# Patient Record
Sex: Male | Born: 1990 | Marital: Single | Smoking: Former smoker
Health system: Southern US, Community
[De-identification: ages and names within clinical notes are randomized; demographics above are authoritative.]

## PROBLEM LIST (undated history)

## (undated) DIAGNOSIS — Z789 Other specified health status: Secondary | ICD-10-CM

## (undated) DIAGNOSIS — F32A Depression, unspecified: Secondary | ICD-10-CM

## (undated) DIAGNOSIS — I10 Essential (primary) hypertension: Secondary | ICD-10-CM

## (undated) DIAGNOSIS — J45909 Unspecified asthma, uncomplicated: Secondary | ICD-10-CM

## (undated) DIAGNOSIS — F329 Major depressive disorder, single episode, unspecified: Secondary | ICD-10-CM

## (undated) HISTORY — DX: Unspecified asthma, uncomplicated: J45.909

## (undated) HISTORY — DX: Depression, unspecified: F32.A

## (undated) HISTORY — DX: Major depressive disorder, single episode, unspecified: F32.9

---
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
## 2012-03-12 ENCOUNTER — Ambulatory Visit (INDEPENDENT_AMBULATORY_CARE_PROVIDER_SITE_OTHER): Payer: 59 | Admitting: Internal Medicine

## 2012-03-12 ENCOUNTER — Encounter: Payer: Self-pay | Admitting: Internal Medicine

## 2012-03-12 VITALS — BP 122/76 | HR 67 | Temp 98.6°F | Ht 65.5 in | Wt 159.2 lb

## 2012-03-12 DIAGNOSIS — L218 Other seborrheic dermatitis: Secondary | ICD-10-CM

## 2012-03-12 DIAGNOSIS — L219 Seborrheic dermatitis, unspecified: Secondary | ICD-10-CM

## 2012-03-12 DIAGNOSIS — Z0000 Encounter for general adult medical examination without abnormal findings: Secondary | ICD-10-CM | POA: Insufficient documentation

## 2012-03-12 MED ORDER — SALICYLIC ACID 3 % EX SHAM: 1.0000 "application " | MEDICATED_SHAMPOO | Freq: Every day | CUTANEOUS | Status: DC | PRN

## 2012-03-12 NOTE — Progress Notes (Signed)
Subjective:    Patient ID: Jordan Miranda, male    DOB: 1990/10/01, 21 y.o.   MRN: [id number]  HPI 21 year old male with history of asthma and depression presents to establish care. He reports he is generally feeling well. He recently graduated from high school and is planning to start community college to study music management. His only concern today is occasional rash with some peeling in his eyebrows and scalp. This is slightly itchy. He has tried anti-dandruff shampoos in the past with some improvement.  Aside from this, he reports that he generally follows a healthy diet with increased fruits and vegetables. He does not follow a specific exercise program however does play outside frequently with his younger brothers. He is not currently sexually active and reports that he is a virgin. He used to smoke but quit approximately one year ago. He does not drink alcohol. He feels safe in his home. He follows Gen. safety measures such as wearing seatbelts while driving and applying sunscreen when outdoors.  Outpatient Encounter Prescriptions as of 03/12/2012  Medication Sig Dispense Refill  . Salicylic Acid 3 % SHAM Apply 1 application topically daily as needed (apply daily x 7 days, allow to sit in hair 20-57min).  480 mL  0   BP 122/76  Pulse 67  Temp 98.6 F (37 C) (Oral)  Ht 5' 5.5" (1.664 m)  Wt 159 lb 4 oz (72.235 kg)  BMI 26.10 kg/m2  SpO2 99%  Review of Systems  Constitutional: Negative for fever, chills, activity change, appetite change, fatigue and unexpected weight change.  Eyes: Negative for visual disturbance.  Respiratory: Negative for cough and shortness of breath.   Cardiovascular: Negative for chest pain, palpitations and leg swelling.  Gastrointestinal: Negative for abdominal pain and abdominal distention.  Genitourinary: Negative for dysuria, urgency and difficulty urinating.  Musculoskeletal: Negative for arthralgias and gait problem.  Skin: Positive for rash.  Negative for color change.  Hematological: Negative for adenopathy.  Psychiatric/Behavioral: Negative for disturbed wake/sleep cycle and dysphoric mood. The patient is not nervous/anxious.        Objective:   Physical Exam  Constitutional: He is oriented to person, place, and time. He appears well-developed and well-nourished. No distress.  HENT:  Head: Normocephalic and atraumatic.  Right Ear: External ear normal.  Left Ear: External ear normal.  Nose: Nose normal.  Mouth/Throat: Oropharynx is clear and moist. No oropharyngeal exudate.  Eyes: Conjunctivae and EOM are normal. Pupils are equal, round, and reactive to light. Right eye exhibits no discharge. Left eye exhibits no discharge. No scleral icterus.  Neck: Normal range of motion. Neck supple. No tracheal deviation present. No thyromegaly present.  Cardiovascular: Normal rate, regular rhythm and normal heart sounds.  Exam reveals no gallop and no friction rub.   No murmur heard. Pulmonary/Chest: Effort normal and breath sounds normal. No respiratory distress. He has no wheezes. He has no rales. He exhibits no tenderness.  Abdominal: Soft. Bowel sounds are normal. He exhibits no distension and no mass. There is no tenderness. There is no guarding.  Musculoskeletal: Normal range of motion. He exhibits no edema.  Lymphadenopathy:    He has no cervical adenopathy.  Neurological: He is alert and oriented to person, place, and time. No cranial nerve deficit. Coordination normal.  Skin: Skin is warm and dry. Rash (some flaking areas over the scalp and eyebrows) noted. He is not diaphoretic. No erythema. No pallor.  Psychiatric: He has a normal mood and affect. His behavior is  normal. Judgment and thought content normal.          Assessment & Plan:

## 2012-03-12 NOTE — Assessment & Plan Note (Signed)
Exam is consistent with seborrheic dermatitis. Will treat with Selsun shampoo. Patient will call if symptoms are not improving.

## 2012-03-12 NOTE — Assessment & Plan Note (Signed)
General exam is normal today. Review Gen. health maintenance. Patient reports that he is up-to-date on vaccinations including tetanus and pertussis vaccine. We discussed getting basic lab work including CBC, CMP, lipid profile. Patient would prefer to hold off for now. He will plan to get labs checked next week or the week after. Discussed basic preventative health measures including STD prevention, general safety while driving and while outdoors. We also discussed importance of abstaining from smoking. Encourage continued efforts at healthy diet and regular physical activity.

## 2012-04-11 ENCOUNTER — Other Ambulatory Visit (INDEPENDENT_AMBULATORY_CARE_PROVIDER_SITE_OTHER): Payer: 59

## 2012-04-11 DIAGNOSIS — Z0000 Encounter for general adult medical examination without abnormal findings: Secondary | ICD-10-CM

## 2012-12-25 IMAGING — CR DG CHEST 2V
1 series · 2 of 2 positions shown · non-contrast
Comparison: none

REASON FOR EXAM: wheezing    Flex 8
COMMENTS:   LMP: (Male)

PROCEDURE:     DXR - DXR CHEST PA (OR AP) AND LATERAL  - July 02, 2011  [DATE]
RESULT:     The lungs are clear. The heart and pulmonary vessels are normal.
The bony and mediastinal structures are unremarkable. There is no effusion.
There is no pneumothorax or evidence of congestive failure.

[Series 1: w chest pa · 0.14mm/px · 2 of 2 slices shown]
[im 1/2]
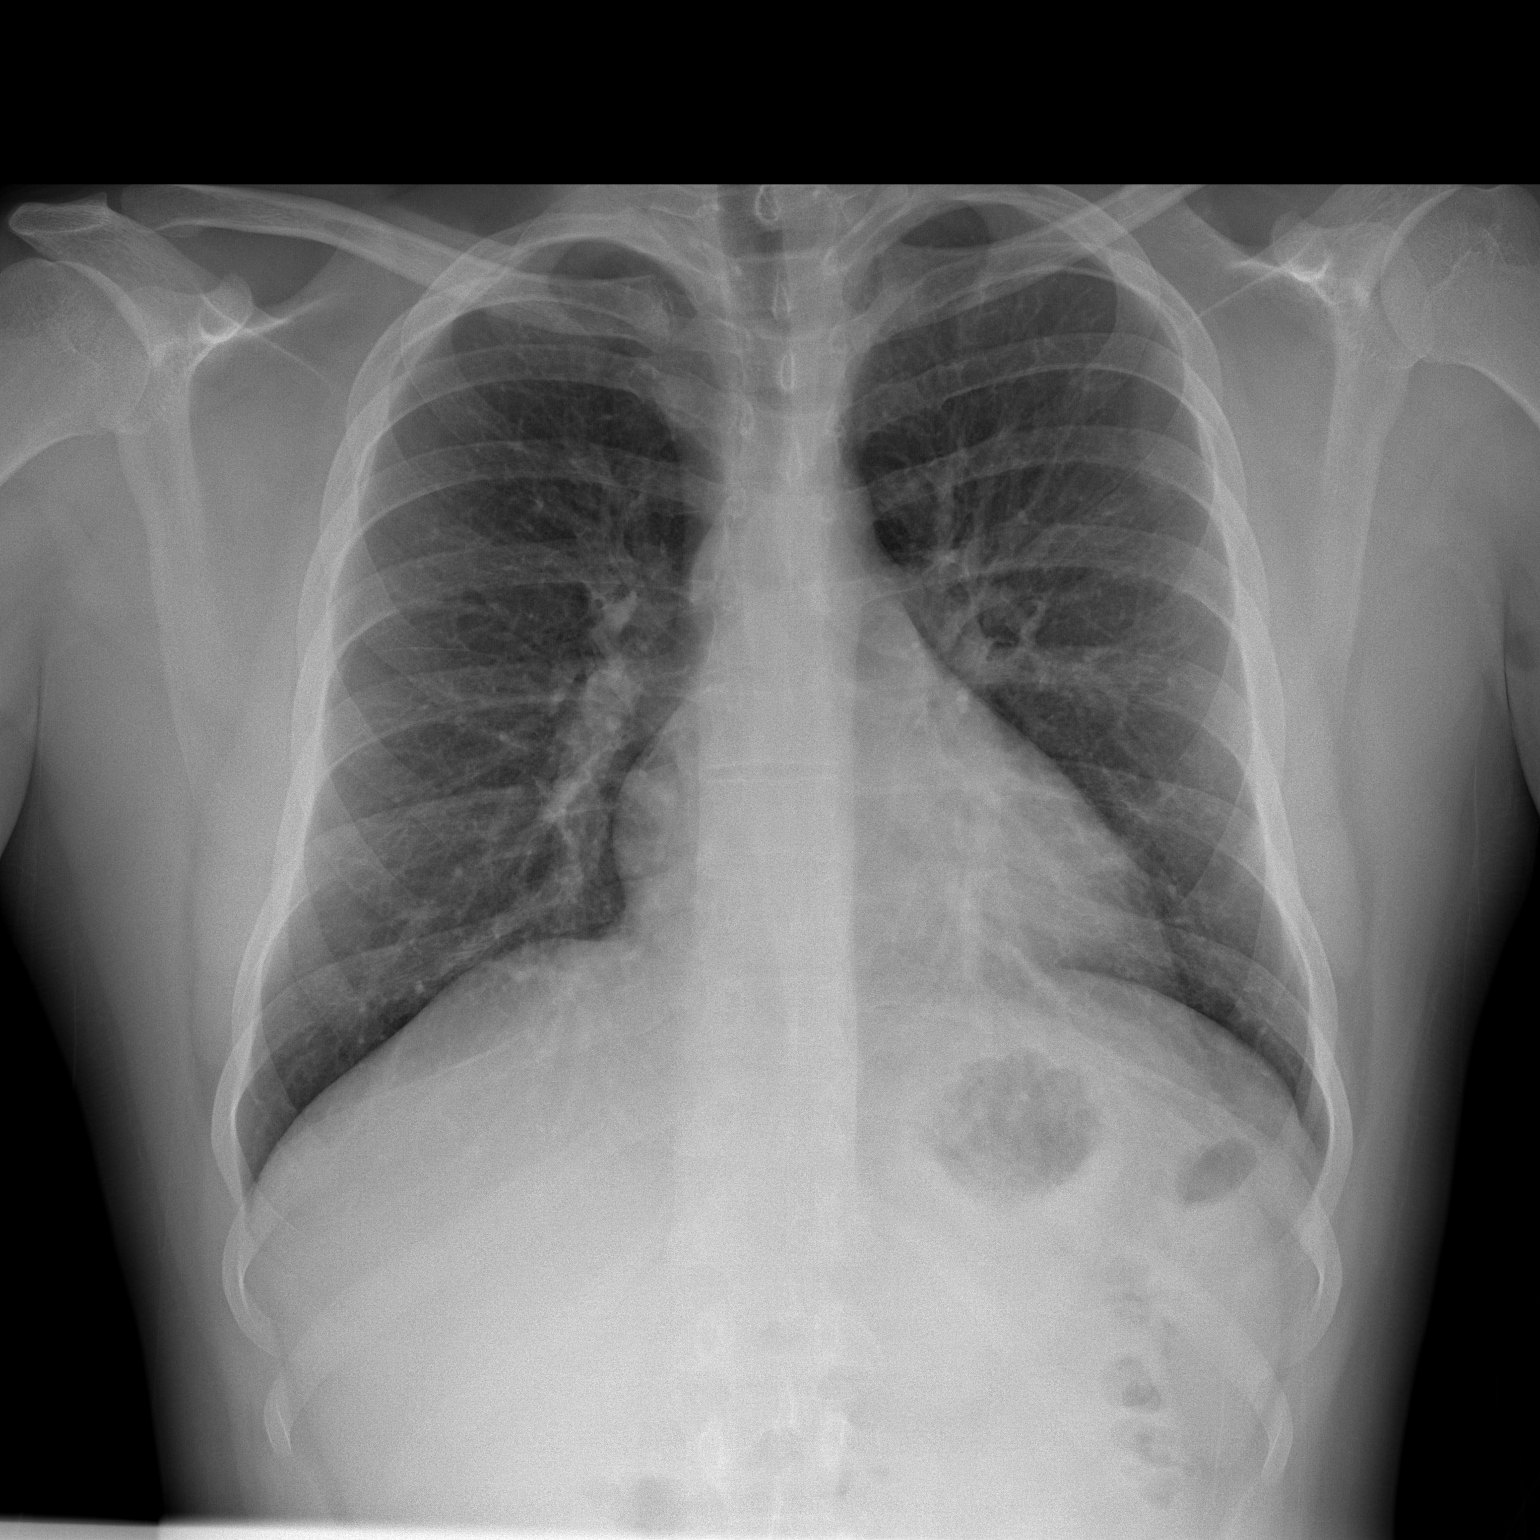
[im 2/2]
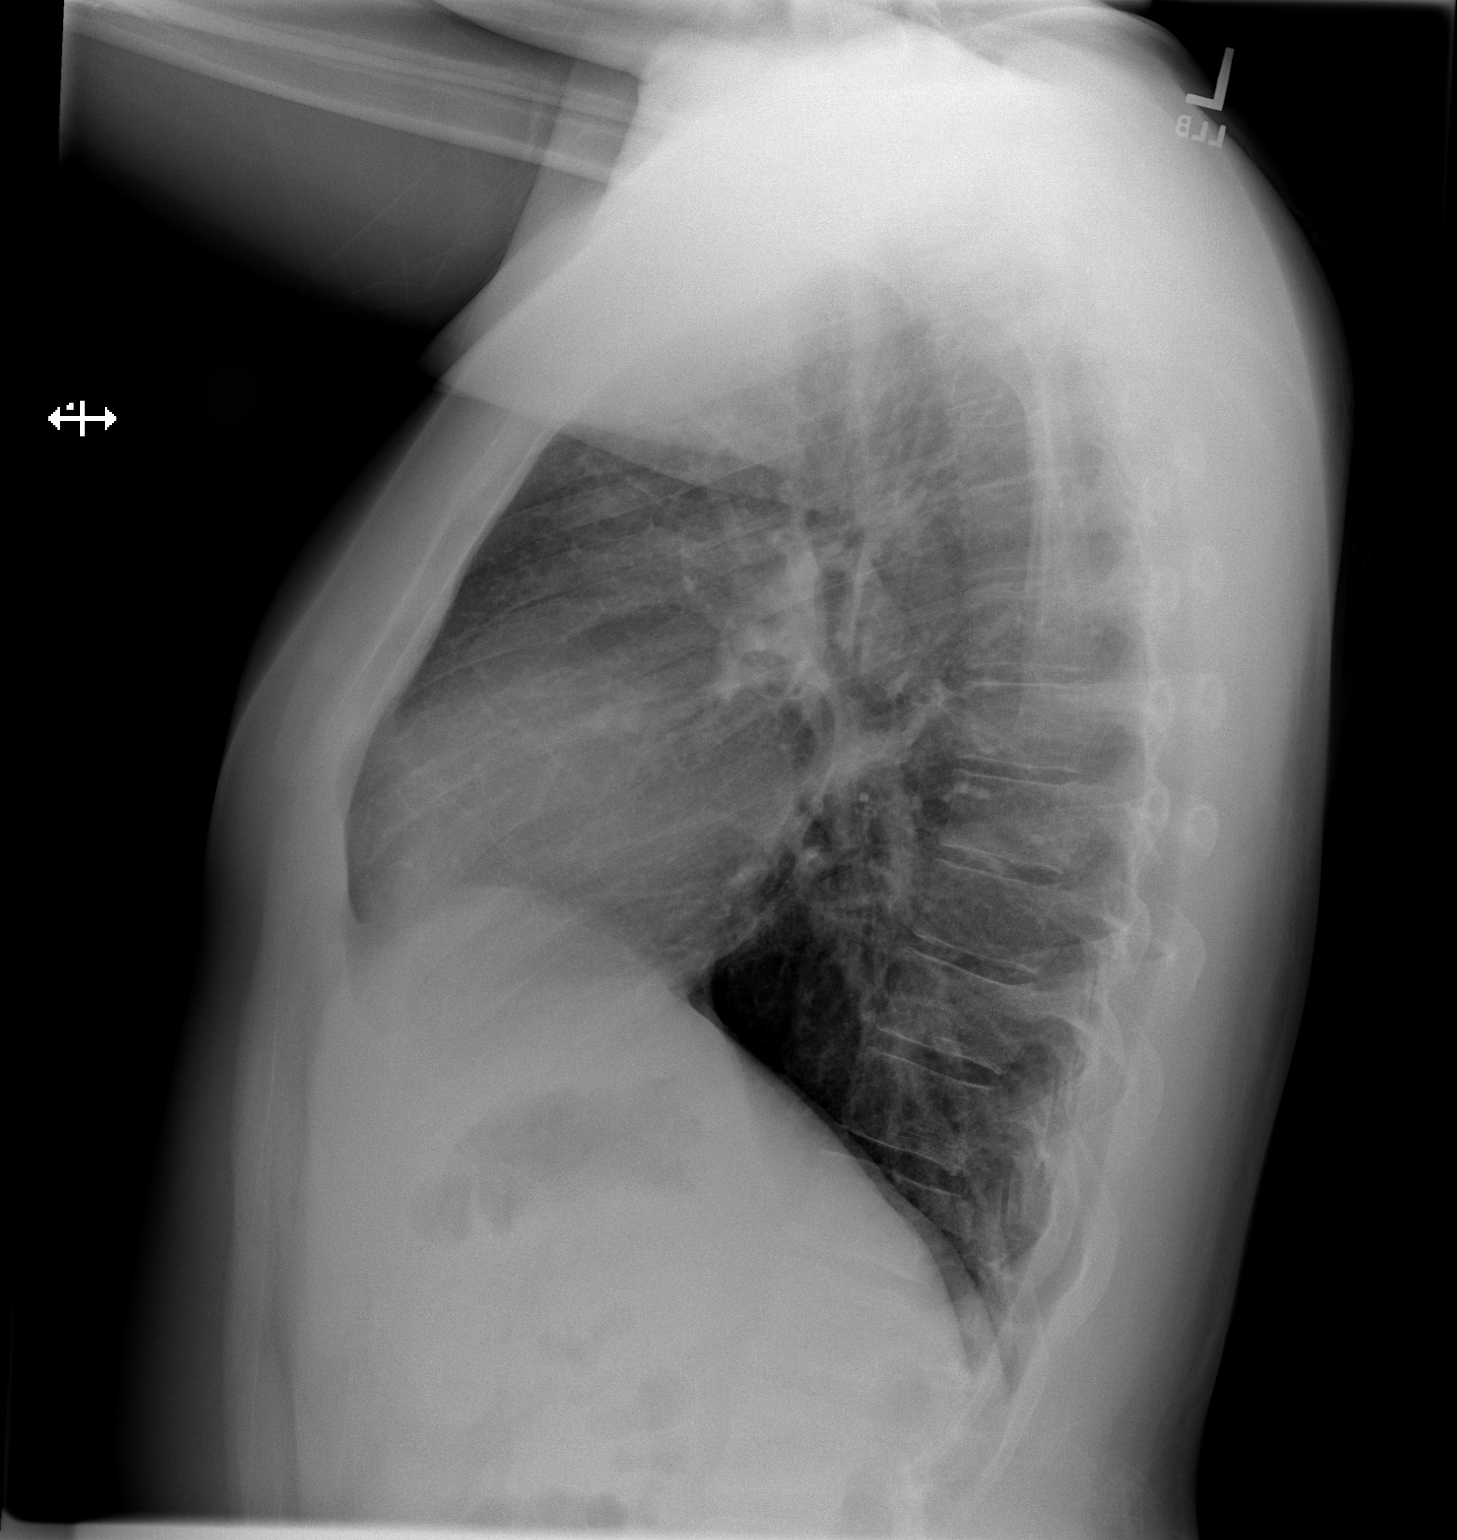

[2 of 2 positions shown; findings below may reference images not displayed]

IMPRESSION: No acute cardiopulmonary disease.

## 2013-09-25 ENCOUNTER — Ambulatory Visit: Payer: 59 | Admitting: Internal Medicine

## 2013-09-25 DIAGNOSIS — Z0289 Encounter for other administrative examinations: Secondary | ICD-10-CM

## 2014-01-29 ENCOUNTER — Ambulatory Visit (INDEPENDENT_AMBULATORY_CARE_PROVIDER_SITE_OTHER): Payer: 59 | Admitting: Adult Health

## 2014-01-29 ENCOUNTER — Encounter: Payer: Self-pay | Admitting: Adult Health

## 2014-01-29 ENCOUNTER — Encounter (INDEPENDENT_AMBULATORY_CARE_PROVIDER_SITE_OTHER): Payer: Self-pay

## 2014-01-29 VITALS — BP 140/82 | HR 73 | Temp 98.2°F | Resp 14 | Wt 183.8 lb

## 2014-01-29 DIAGNOSIS — L219 Seborrheic dermatitis, unspecified: Secondary | ICD-10-CM

## 2014-01-29 DIAGNOSIS — R42 Dizziness and giddiness: Secondary | ICD-10-CM

## 2014-01-29 DIAGNOSIS — F341 Dysthymic disorder: Secondary | ICD-10-CM

## 2014-01-29 DIAGNOSIS — F418 Other specified anxiety disorders: Secondary | ICD-10-CM

## 2014-01-29 DIAGNOSIS — L218 Other seborrheic dermatitis: Secondary | ICD-10-CM

## 2014-01-29 MED ORDER — SALICYLIC ACID 3 % EX SHAM: 1.0000 "application " | MEDICATED_SHAMPOO | Freq: Every day | CUTANEOUS | Status: DC | PRN

## 2014-01-29 MED ORDER — FLUOXETINE HCL 20 MG PO CAPS: 20.0000 mg | ORAL_CAPSULE | Freq: Every day | ORAL | Status: DC

## 2014-01-29 MED ORDER — BUSPIRONE HCL 5 MG PO TABS: 5.0000 mg | ORAL_TABLET | Freq: Three times a day (TID) | ORAL | Status: DC

## 2014-01-29 NOTE — Progress Notes (Signed)
Patient ID: Jordan Miranda, male   DOB: 07/24/1990, 23 y.o.   MRN: [id number]   Subjective:    Patient ID: Jordan MuttersMichael Austin Miranda, male    DOB: 05/28/1991, 23 y.o.   MRN: [id number]  HPI  "Dizziness" that started a few weeks ago. He reports that he was watching an interview with an artist that he likes and he noticed that this artist has the same symptoms - Foggy feeling, out of body,  "it is not him", a dream-like state. He reports having the feeling now. He was smoking marijuana regularly and quit about a month ago. He has been smoking pot since high school and would stop periodically without having any of these symptoms. Reports "he almost feels like a disassociation feeling".  He has periodic episodes of feeling depressed. He also has social anxiety disorder. Stresses about being in a crowd and speaking to people. He wonders if his symptoms are a way that he is using to help cope with this disorder. He has not taken any medication for depression or anxiety. Feels that anxiety is more of a problem for him.  Past Medical History  Diagnosis Date  . Depression   . Asthma     No current outpatient prescriptions on file prior to visit.   No current facility-administered medications on file prior to visit.     Review of Systems  Neurological: Positive for dizziness.       "weird feeling"  Psychiatric/Behavioral: Positive for sleep disturbance (cannot turn off his thoughts). Negative for suicidal ideas and self-injury. The patient is nervous/anxious.        Mild depression, foggy feeling, out of body experience, disassociation.   All other systems reviewed and are negative.      Objective:  BP 140/82  Pulse 73  Temp(Src) 98.2 F (36.8 C) (Oral)  Resp 14  Wt 183 lb 12 oz (83.348 kg)  SpO2 100%   Physical Exam  Constitutional: He is oriented to person, place, and time. No distress.  HENT:  Head: Normocephalic and atraumatic.  Eyes: Conjunctivae and EOM are normal.  Neck: Neck  supple.  Cardiovascular: Normal rate and regular rhythm.  Exam reveals no gallop.   No murmur heard. Pulmonary/Chest: Effort normal and breath sounds normal. No respiratory distress.  Musculoskeletal: Normal range of motion.  Neurological: He is alert and oriented to person, place, and time. No cranial nerve deficit. Coordination normal.  Skin: Skin is warm and dry.  Psychiatric: He has a normal mood and affect. His behavior is normal.  He has good eye contact and communicates well with provider.       Assessment & Plan:   1. Dizziness His description of symptoms does not sound like dizziness but more along the lines of panic disorder and depression. I will check labs to r/o clinical reason for his symptoms.  - CBC with Differential - TSH - Comprehensive metabolic panel  2. Depression with anxiety Start Prozac 20 mg daily and buspar 5 mg bid Return for follow up in 1 month If symptoms listed in HPI do not improve would recommend referral to Psych.

## 2014-01-29 NOTE — Progress Notes (Signed)
Pre visit review using our clinic review tool, if applicable. No additional management support is needed unless otherwise documented below in the visit note. 

## 2014-01-30 LAB — CBC WITH DIFFERENTIAL/PLATELET
Basophils Absolute: 0 10*3/uL (ref 0.0–0.2)
Basos: 1 %
Eos: 2 %
Eosinophils Absolute: 0.1 10*3/uL (ref 0.0–0.4)
HCT: 45.1 % (ref 37.5–51.0)
Hemoglobin: 15.6 g/dL (ref 12.6–17.7)
Immature Grans (Abs): 0 10*3/uL (ref 0.0–0.1)
Immature Granulocytes: 0 %
Lymphocytes Absolute: 2.9 10*3/uL (ref 0.7–3.1)
Lymphs: 52 %
MCH: 29.1 pg (ref 26.6–33.0)
MCHC: 34.6 g/dL (ref 31.5–35.7)
MCV: 84 fL (ref 79–97)
Monocytes Absolute: 0.4 10*3/uL (ref 0.1–0.9)
Monocytes: 8 %
Neutrophils Absolute: 2 10*3/uL (ref 1.4–7.0)
Neutrophils Relative %: 37 %
RBC: 5.37 x10E6/uL (ref 4.14–5.80)
RDW: 13 % (ref 12.3–15.4)
WBC: 5.5 10*3/uL (ref 3.4–10.8)

## 2014-01-30 LAB — COMPREHENSIVE METABOLIC PANEL
ALT: 30 IU/L (ref 0–44)
AST: 20 IU/L (ref 0–40)
Albumin/Globulin Ratio: 2.1 (ref 1.1–2.5)
Albumin: 4.8 g/dL (ref 3.5–5.5)
Alkaline Phosphatase: 101 IU/L (ref 39–117)
BUN/Creatinine Ratio: 12 (ref 8–19)
BUN: 12 mg/dL (ref 6–20)
CO2: 24 mmol/L (ref 18–29)
Calcium: 9.8 mg/dL (ref 8.7–10.2)
Chloride: 103 mmol/L (ref 97–108)
Creatinine, Ser: 1.02 mg/dL (ref 0.76–1.27)
GFR calc Af Amer: 122 mL/min/{1.73_m2} (ref >59)
GFR calc non Af Amer: 105 mL/min/{1.73_m2} (ref >59)
Globulin, Total: 2.3 g/dL (ref 1.5–4.5)
Glucose: 98 mg/dL (ref 65–99)
Potassium: 4.1 mmol/L (ref 3.5–5.2)
Sodium: 146 mmol/L — ABNORMAL HIGH (ref 134–144)
Total Bilirubin: 0.4 mg/dL (ref 0.0–1.2)
Total Protein: 7.1 g/dL (ref 6.0–8.5)

## 2014-01-30 LAB — TSH: TSH: 1.99 u[IU]/mL (ref 0.450–4.500)

## 2014-02-01 ENCOUNTER — Other Ambulatory Visit: Payer: Self-pay | Admitting: Adult Health

## 2014-02-01 DIAGNOSIS — E870 Hyperosmolality and hypernatremia: Secondary | ICD-10-CM

## 2014-02-16 ENCOUNTER — Other Ambulatory Visit: Payer: 59

## 2014-02-17 ENCOUNTER — Encounter: Payer: Self-pay | Admitting: Adult Health

## 2014-02-17 ENCOUNTER — Ambulatory Visit (INDEPENDENT_AMBULATORY_CARE_PROVIDER_SITE_OTHER): Payer: 59 | Admitting: Adult Health

## 2014-02-17 ENCOUNTER — Other Ambulatory Visit (INDEPENDENT_AMBULATORY_CARE_PROVIDER_SITE_OTHER): Payer: 59

## 2014-02-17 VITALS — BP 135/88 | HR 80 | Temp 98.0°F | Resp 14 | Wt 181.8 lb

## 2014-02-17 DIAGNOSIS — F418 Other specified anxiety disorders: Secondary | ICD-10-CM

## 2014-02-17 DIAGNOSIS — E870 Hyperosmolality and hypernatremia: Secondary | ICD-10-CM

## 2014-02-17 DIAGNOSIS — F341 Dysthymic disorder: Secondary | ICD-10-CM

## 2014-02-17 DIAGNOSIS — R059 Cough, unspecified: Secondary | ICD-10-CM

## 2014-02-17 DIAGNOSIS — H65199 Other acute nonsuppurative otitis media, unspecified ear: Secondary | ICD-10-CM

## 2014-02-17 DIAGNOSIS — H65192 Other acute nonsuppurative otitis media, left ear: Secondary | ICD-10-CM

## 2014-02-17 DIAGNOSIS — R05 Cough: Secondary | ICD-10-CM

## 2014-02-17 MED ORDER — AZITHROMYCIN 250 MG PO TABS: ORAL_TABLET | ORAL | Status: DC

## 2014-02-17 MED ORDER — ALBUTEROL SULFATE (2.5 MG/3ML) 0.083% IN NEBU
2.5000 mg | INHALATION_SOLUTION | Freq: Once | RESPIRATORY_TRACT | Status: AC
Start: 2014-02-17 — End: 2014-02-17
  Administered 2014-02-17: 2.5 mg via RESPIRATORY_TRACT

## 2014-02-17 MED ORDER — ALBUTEROL SULFATE HFA 108 (90 BASE) MCG/ACT IN AERS: 2.0000 | INHALATION_SPRAY | Freq: Four times a day (QID) | RESPIRATORY_TRACT | Status: DC | PRN

## 2014-02-17 MED ORDER — PREDNISONE 10 MG PO TABS: ORAL_TABLET | ORAL | Status: DC

## 2014-02-17 NOTE — Patient Instructions (Addendum)
  Please go to the [facility] for your chest xray.  Start Azithromycin 2 tablets today then 1 tablet daily for the next 4 days.  Albuterol inhaler 2 puff into the lungs every 6 hours for the next 3 days then use as needed for coughing, shortness of breath, wheezing or chest tightness  Start prednisone taper as follows:  Day #1 - take 6 tablets Day #2 - take 5 tablets Day #3 - take 4 tablets Day #4 - take 3 tablets Day #5 - take 2 tablets Day #6 - take 1 tablet  Return to clinic for follow up in 1 week.  I am referring you to Psychiatry for your depression/anxiety.

## 2014-02-17 NOTE — Progress Notes (Signed)
Pre visit review using our clinic review tool, if applicable. No additional management support is needed unless otherwise documented below in the visit note. 

## 2014-02-17 NOTE — Progress Notes (Signed)
Patient ID: Jordan Miranda, male   DOB: 09/12/1990, 23 y.o.   MRN: [id number]   Subjective:    Patient ID: Jordan MuttersMichael Austin Burciaga, male    DOB: 01/26/1991, 23 y.o.   MRN: [id number]  Cough Associated symptoms include ear pain, a fever, postnasal drip, rhinorrhea and a sore throat. Pertinent negatives include no chills, shortness of breath or wheezing.    Pt presents with coughing, sinus drainage (green), fever, chills, left ear pain that started approximately 4 days ago. He has been taking DayQuil and tylenol for his fever. Denies shortness of breath.  Also, pt was seen in clinic on 01/29/14 for symptoms of depression with anxiety and foggy feeling, "out of body experience". He was started on prozac and buspar. Reports he did not tolerate either of these medications. He denies suicidal ideations. He reports that his mother has told him that he should see a psychiatrist.   Past Medical History  Diagnosis Date  . Depression   . Asthma     Current Outpatient Prescriptions on File Prior to Visit  Medication Sig Dispense Refill  . Salicylic Acid 3 % SHAM Apply 1 application topically daily as needed (apply daily x 7 days, allow to sit in hair 20-2530min).  480 mL  0   No current facility-administered medications on file prior to visit.     Review of Systems  Constitutional: Positive for fever. Negative for chills.  HENT: Positive for congestion, ear pain, postnasal drip, rhinorrhea, sinus pressure and sore throat.   Respiratory: Positive for cough. Negative for shortness of breath and wheezing.   Gastrointestinal: Negative.   Psychiatric/Behavioral: The patient is nervous/anxious.        Depression  All other systems reviewed and are negative.      Objective:  BP 135/88  Pulse 80  Temp(Src) 98 F (36.7 C) (Oral)  Resp 14  Wt 181 lb 12 oz (82.441 kg)  SpO2 99%   Physical Exam  Constitutional: He is oriented to person, place, and time. He appears well-developed and  well-nourished. No distress.  HENT:  Head: Normocephalic and atraumatic.  Right Ear: External ear normal.  Mouth/Throat: Oropharynx is clear and moist. No oropharyngeal exudate.  Left otitis media  Eyes: Conjunctivae and EOM are normal.  Neck: Normal range of motion. Neck supple.  Cardiovascular: Normal rate, regular rhythm and normal heart sounds.  Exam reveals no gallop and no friction rub.   No murmur heard. Pulmonary/Chest: Effort normal. No respiratory distress. He has wheezes. He has no rales.  Rhonchi scattered  Musculoskeletal: Normal range of motion.  Lymphadenopathy:    He has no cervical adenopathy.  Neurological: He is alert and oriented to person, place, and time.  Skin: Skin is warm and dry.  Psychiatric: He has a normal mood and affect. His behavior is normal. Judgment and thought content normal.      Assessment & Plan:   1. Cough Lungs sound congested, wheezing. Suspect CAP. Send for chest xray. Start Azithromycin. RTC in 1 week for f/u - DG Chest 2 View; Future  2. Depression with anxiety Refer to psychiatry for his ongoing symptoms. Did not tolerate prozac or buspar. Reports having "out of body experiences", foggy feeling. - Ambulatory referral to Psychiatry  3. Acute nonsuppurative otitis media of left ear Azithromycin

## 2014-02-17 NOTE — Addendum Note (Signed)
Addended by: Chandra BatchNIXON, Abella Shugart E on: 02/17/2014 01:38 PM   Modules accepted: Orders

## 2014-02-24 ENCOUNTER — Other Ambulatory Visit: Payer: 59

## 2015-12-26 ENCOUNTER — Ambulatory Visit
Admission: EM | Admit: 2015-12-26 | Discharge: 2015-12-26 | Disposition: A | Discharge status: Home / Self Care | Payer: 59 | Attending: Emergency Medicine | Admitting: Emergency Medicine

## 2015-12-26 DIAGNOSIS — J029 Acute pharyngitis, unspecified: Secondary | ICD-10-CM | POA: Diagnosis not present

## 2015-12-26 DIAGNOSIS — J309 Allergic rhinitis, unspecified: Secondary | ICD-10-CM

## 2015-12-26 HISTORY — DX: Other specified health status: Z78.9

## 2015-12-26 LAB — RAPID STREP SCREEN (MED CTR MEBANE ONLY): Streptococcus, Group A Screen (Direct): NEGATIVE

## 2015-12-26 MED ORDER — LIDOCAINE VISCOUS 2 % MT SOLN: 15.0000 mL | Freq: Three times a day (TID) | OROMUCOSAL | Status: DC | PRN

## 2015-12-26 MED ORDER — LORATADINE 10 MG PO TABS: 10.0000 mg | ORAL_TABLET | Freq: Every day | ORAL | Status: DC

## 2015-12-26 NOTE — Discharge Instructions (Signed)
Take medication as prescribed. Rest. Drink plenty of fluids.   Follow up with your primary care physician this week as needed. Return to Urgent care for new or worsening concerns.    Allergic Rhinitis Allergic rhinitis is when the mucous membranes in the nose respond to allergens. Allergens are particles in the air that cause your body to have an allergic reaction. This causes you to release allergic antibodies. Through a chain of events, these eventually cause you to release histamine into the blood stream. Although meant to protect the body, it is this release of histamine that causes your discomfort, such as frequent sneezing, congestion, and an itchy, runny nose.  CAUSES Seasonal allergic rhinitis (hay fever) is caused by pollen allergens that may come from grasses, trees, and weeds. Year-round allergic rhinitis (perennial allergic rhinitis) is caused by allergens such as house dust mites, pet dander, and mold spores. SYMPTOMS  Nasal stuffiness (congestion).  Itchy, runny nose with sneezing and tearing of the eyes. DIAGNOSIS Your health care provider can help you determine the allergen or allergens that trigger your symptoms. If you and your health care provider are unable to determine the allergen, skin or blood testing may be used. Your health care provider will diagnose your condition after taking your health history and performing a physical exam. Your health care provider may assess you for other related conditions, such as asthma, pink eye, or an ear infection. TREATMENT Allergic rhinitis does not have a cure, but it can be controlled by:  Medicines that block allergy symptoms. These may include allergy shots, nasal sprays, and oral antihistamines.  Avoiding the allergen. Hay fever may often be treated with antihistamines in pill or nasal spray forms. Antihistamines block the effects of histamine. There are over-the-counter medicines that may help with nasal congestion and swelling  around the eyes. Check with your health care provider before taking or giving this medicine. If avoiding the allergen or the medicine prescribed do not work, there are many new medicines your health care provider can prescribe. Stronger medicine may be used if initial measures are ineffective. Desensitizing injections can be used if medicine and avoidance does not work. Desensitization is when a patient is given ongoing shots until the body becomes less sensitive to the allergen. Make sure you follow up with your health care provider if problems continue. HOME CARE INSTRUCTIONS It is not possible to completely avoid allergens, but you can reduce your symptoms by taking steps to limit your exposure to them. It helps to know exactly what you are allergic to so that you can avoid your specific triggers. SEEK MEDICAL CARE IF:  You have a fever.  You develop a cough that does not stop easily (persistent).  You have shortness of breath.  You start wheezing.  Symptoms interfere with normal daily activities.   This information is not intended to replace advice given to you by your health care provider. Make sure you discuss any questions you have with your health care provider.   Document Released: 04/03/2001 Document Revised: 07/30/2014 Document Reviewed: 03/16/2013 Elsevier Interactive Patient Education 2016 [employer]  Pharyngitis Pharyngitis is redness, pain, and swelling (inflammation) of your pharynx.  CAUSES  Pharyngitis is usually caused by infection. Most of the time, these infections are from viruses (viral) and are part of a cold. However, sometimes pharyngitis is caused by bacteria (bacterial). Pharyngitis can also be caused by allergies. Viral pharyngitis may be spread from person to person by coughing, sneezing, and personal items or utensils (  cups, forks, spoons, toothbrushes). Bacterial pharyngitis may be spread from person to person by more intimate contact, such as kissing.    SIGNS AND SYMPTOMS  Symptoms of pharyngitis include:   Sore throat.   Tiredness (fatigue).   Low-grade fever.   Headache.  Joint pain and muscle aches.  Skin rashes.  Swollen lymph nodes.  Plaque-like film on throat or tonsils (often seen with bacterial pharyngitis). DIAGNOSIS  Your health care provider will ask you questions about your illness and your symptoms. Your medical history, along with a physical exam, is often all that is needed to diagnose pharyngitis. Sometimes, a rapid strep test is done. Other lab tests may also be done, depending on the suspected cause.  TREATMENT  Viral pharyngitis will usually get better in 3-4 days without the use of medicine. Bacterial pharyngitis is treated with medicines that kill germs (antibiotics).  HOME CARE INSTRUCTIONS   Drink enough water and fluids to keep your urine clear or pale yellow.   Only take over-the-counter or prescription medicines as directed by your health care provider:   If you are prescribed antibiotics, make sure you finish them even if you start to feel better.   Do not take aspirin.   Get lots of rest.   Gargle with 8 oz of salt water ( tsp of salt per 1 qt of water) as often as every 1-2 hours to soothe your throat.   Throat lozenges (if you are not at risk for choking) or sprays may be used to soothe your throat. SEEK MEDICAL CARE IF:   You have large, tender lumps in your neck.  You have a rash.  You cough up green, yellow-brown, or bloody spit. SEEK IMMEDIATE MEDICAL CARE IF:   Your neck becomes stiff.  You drool or are unable to swallow liquids.  You vomit or are unable to keep medicines or liquids down.  You have severe pain that does not go away with the use of recommended medicines.  You have trouble breathing (not caused by a stuffy nose). MAKE SURE YOU:   Understand these instructions.  Will watch your condition.  Will get help right away if you are not doing well or  get worse.   This information is not intended to replace advice given to you by your health care provider. Make sure you discuss any questions you have with your health care provider.   Document Released: 07/09/2005 Document Revised: 04/29/2013 Document Reviewed: 03/16/2013 Elsevier Interactive Patient Education [employer]

## 2015-12-26 NOTE — ED Provider Notes (Signed)
[facility]  ____________________________________________  Time seen: Approximately 10:03 AM  I have reviewed the triage vital signs and the nursing notes.   HISTORY  Chief Complaint Sore Throat   HPI Jordan Miranda is a 25 y.o. male presents for the complaint of sore throat since this morning. Patient reports some nasal congestion, and rare cough. Patient states sore throat is a 2 out of 10 scratchy irritation sensation. Reports able to continue to eat and drink well. Reports some history of seasonal allergies. Reports did take over-the-counter DayQuil this morning which helped the symptoms. Denies fevers. Reports his cousin which he was around this weekend was just diagnosed with strep throat Friday, and patient expresses concern of strep throat.  Denies chest pain, shortness of breath, dizziness, weakness, fevers, vision changes, neck pain, back pain, rash, abdominal pain, dysuria, dizziness or weakness.   Past Medical History  Diagnosis Date  . Patient denies medical problems     There are no active problems to display for this patient.   History reviewed. No pertinent past surgical history.  Current Outpatient Rx  Name  Route  Sig  Dispense  Refill  .           Marland Kitchen             Allergies Review of patient's allergies indicates no known allergies.  History reviewed. No pertinent family history.  Social History Social History  Substance Use Topics  . Smoking status: Former Games developer  . Smokeless tobacco: None  . Alcohol Use: Yes     Comment: social    Review of Systems Constitutional: No fever/chills Eyes: No visual changes. ENT: As above.  Cardiovascular: Denies chest pain. Respiratory: Denies shortness of breath. Gastrointestinal: No abdominal pain.  No nausea, no vomiting.  No diarrhea.  No constipation. Genitourinary: Negative for dysuria. Musculoskeletal: Negative for back pain. Skin: Negative for rash. Neurological: Negative for headaches,  focal weakness or numbness.  10-point ROS otherwise negative.  ____________________________________________   PHYSICAL EXAM:  VITAL SIGNS: ED Triage Vitals  Enc Vitals Group     BP 12/26/15 0952 153/100 mmHg     Pulse Rate 12/26/15 0952 57     Resp 12/26/15 0952 20     Temp 12/26/15 0952 98.1 F (36.7 C)     Temp Source 12/26/15 0952 Oral     SpO2 12/26/15 0952 100 %     Weight 12/26/15 0952 207 lb 5 oz (94.036 kg)     Height 12/26/15 0952 5' 6.5" (1.689 m)     Head Cir --      Peak Flow --      Pain Score 12/26/15 0956 2     Pain Loc --      Pain Edu? --      Excl. in GC? --    Constitutional: Alert and oriented. Well appearing and in no acute distress. Eyes: Conjunctivae are normal. PERRL. EOMI. Head: Atraumatic. No sinus tenderness to palpation. No swelling. No erythema.  Ears: no erythema, normal TMs bilaterally.   Nose:Mild nasal congestion with clear rhinorrhea  Mouth/Throat: Mucous membranes are moist. Mild pharyngeal erythema. No tonsillar swelling or exudate.  Neck: No stridor.  No cervical spine tenderness to palpation. Hematological/Lymphatic/Immunilogical: No cervical lymphadenopathy. Cardiovascular: Normal rate, regular rhythm. Grossly normal heart sounds.  Good peripheral circulation. Respiratory: Normal respiratory effort.  No retractions. Lungs CTAB.No wheezes, rales or rhonchi. Good air movement.  Gastrointestinal: Soft and nontender. Normal Bowel sounds. No CVA tenderness. Musculoskeletal: No lower  or upper extremity tenderness nor edema. No cervical, thoracic or lumbar tenderness to palpation. Neurologic:  Normal speech and language. No gross focal neurologic deficits are appreciated. No gait instability. Skin:  Skin is warm, dry and intact. No rash noted. Psychiatric: Mood and affect are normal. Speech and behavior are normal.  ____________________________________________   LABS (all labs ordered are listed, but only abnormal results are  displayed)  Labs Reviewed  RAPID STREP SCREEN (NOT [facility])  CULTURE, GROUP A STREP [facility])    INITIAL IMPRESSION / ASSESSMENT AND PLAN / ED COURSE  Pertinent labs & imaging results that were available during my care of the patient were reviewed by me and considered in my medical decision making (see chart for details).  Overall very well-appearing patient. No acute distress. Presents for the complaints of sore throat since this a.m. Also reports some runny nose and occasional cough. Recent strep exposure and concern of strep throat per patient. Lungs clear throughout. Abdomen soft and nontender. Mild pharyngeal erythema, no tonsillar swelling or exudate. Suspect viral pharyngitis and upper respiratory infection versus allergic rhinitis. Will evaluate quick strep.   Quick strep negative, will culture. Suspect viral pharyngitis and upper respiratory infection versus allergic rhinitis. Discussed with patient and his aunt at bedside, will treat supportively and symptomatically. When necessary Viscous Lidocaine gargles. When necessary Claritin. Encouraged rest, fluids, over-the-counter Tylenol or ibuprofen as needed. Work note for today given. Discussed indication, risks and benefits of medications with patient.  Also discussed blood pressure in urgent care today and encourage close follow-up. Patient denies any history of blood pressure elevation. Denies chest pain, shortness of breath, dizziness, vision changes, headache or weakness. Discussed with patient monitoring over-the-counter medication use as some over-the-counter medications may be contributing elevation of blood pressure. Encouraged close PCP follow-up and patient and his aunt at bedside verbalized understanding and they will follow-up this week.  Discussed follow up with Primary care physician this week. Discussed follow up and return parameters including no resolution or any worsening concerns. Patient verbalized understanding and agreed  to plan.   ____________________________________________   FINAL CLINICAL IMPRESSION(S) / ED DIAGNOSES  Final diagnoses:  Pharyngitis  Allergic rhinitis, unspecified allergic rhinitis type     Discharge Medication List as of 12/26/2015 10:20 AM    START taking these medications   Details  lidocaine (XYLOCAINE) 2 % solution Use as directed 15 mLs in the mouth or throat every 8 (eight) hours as needed (sore throat. gargle and spit as needed for sore throat.)., Starting 12/26/2015, Until Discontinued, Normal    loratadine (CLARITIN) 10 MG tablet Take 1 tablet (10 mg total) by mouth daily., Starting 12/26/2015, Until Discontinued, Normal        Note: This dictation was prepared with Dragon dictation along with smaller phrase technology. Any transcriptional errors that result from this process are unintentional.       Renford DillsLindsey Zaydrian Batta, NP 12/26/15 1105

## 2015-12-28 LAB — CULTURE, GROUP A STREP (THRC)

## 2024-05-20 ENCOUNTER — Encounter: Payer: Self-pay | Admitting: Emergency Medicine

## 2024-05-20 ENCOUNTER — Ambulatory Visit
Admission: EM | Admit: 2024-05-20 | Discharge: 2024-05-20 | Disposition: A | Discharge status: Home / Self Care | Attending: Physician Assistant | Admitting: Physician Assistant

## 2024-05-20 DIAGNOSIS — H5789 Other specified disorders of eye and adnexa: Secondary | ICD-10-CM | POA: Diagnosis not present

## 2024-05-20 DIAGNOSIS — H00011 Hordeolum externum right upper eyelid: Secondary | ICD-10-CM | POA: Diagnosis not present

## 2024-05-20 HISTORY — DX: Essential (primary) hypertension: I10

## 2024-05-20 MED ORDER — ERYTHROMYCIN 5 MG/GM OP OINT: TOPICAL_OINTMENT | OPHTHALMIC | 0 refills | Status: AC

## 2024-05-20 NOTE — ED Provider Notes (Signed)
 [facility]    CSN: [id number] Arrival date & time: 05/20/24  1723      History   Chief Complaint Chief Complaint  Patient presents with   eye irritation     HPI ALYJAH LOVINGOOD is a 33 y.o. male presenting for 3 day history of right eye irritation and foreign body sensation. Reports swelling of upper right eyelid. States he woke up with symptoms. Denies significant pain. Reports slightly reduced vision in this eye. Does not wear contacts/glasses. Has been using eye wash and eye drops without relief. Although he says it is getting better each day. He wants to make sure there is nothing in the eye.   HPI  Past Medical History:  Diagnosis Date   Asthma    Depression    Hypertension     Patient Active Problem List   Diagnosis Date Noted   Depression with anxiety 01/29/2014   Dizziness 01/29/2014   General medical exam 03/12/2012   Seborrheic dermatitis of scalp 03/12/2012    History reviewed. No pertinent surgical history.     Home Medications    Prior to Admission medications   Medication Sig Start Date End Date Taking? Authorizing Provider  erythromycin ophthalmic ointment Place a 1/2 inch ribbon of ointment into the right eyelid q6h x 1 week 05/20/24  Yes Arvis Jolan NOVAK, PA-C    Family History Family History  Problem Relation Age of Onset   Alcohol abuse Maternal Grandmother    Arthritis Maternal Grandmother    Cancer Maternal Grandmother        lung   Heart disease Maternal Grandmother    Diabetes Maternal Grandmother    Mental illness Maternal Grandmother    Alcohol abuse Maternal Grandfather    Arthritis Maternal Grandfather    Cancer Maternal Grandfather        prostate   Heart disease Maternal Grandfather    Diabetes Maternal Grandfather    Mental illness Maternal Grandfather     Social History Social History   Tobacco Use   Smoking status: Former  Building Services Engineer status: Never Used  Substance Use Topics   Alcohol  use: Not Currently    Comment: social   Drug use: No     Allergies   Patient has no known allergies.   Review of Systems Review of Systems  HENT:  Negative for facial swelling.   Eyes:  Positive for pain, redness and visual disturbance. Negative for photophobia, discharge and itching.  Neurological:  Negative for dizziness and headaches.     Physical Exam Triage Vital Signs ED Triage Vitals  Encounter Vitals Group     BP      Girls Systolic BP Percentile      Girls Diastolic BP Percentile      Boys Systolic BP Percentile      Boys Diastolic BP Percentile      Pulse      Resp      Temp      Temp src      SpO2      Weight      Height      Head Circumference      Peak Flow      Pain Score      Pain Loc      Pain Education      Exclude from Growth Chart    No data found.  Updated Vital Signs BP (!) 158/104 (BP Location: Left Arm)   Pulse  75   Temp 99 F (37.2 C) (Oral)   Resp 18   SpO2 95%   Visual Acuity Right Eye Distance: 20/50 Left Eye Distance: 20/25 Bilateral Distance: 20/25 (uncorrected)   Physical Exam Vitals and nursing note reviewed.  Constitutional:      General: He is not in acute distress.    Appearance: Normal appearance. He is well-developed. He is not ill-appearing.  HENT:     Head: Normocephalic and atraumatic.  Eyes:     General: Lids are everted, no foreign bodies appreciated. No scleral icterus.       Right eye: Hordeolum (mild swelling and erythema right upper eyelid --consistent with developing stye) present.     Extraocular Movements: Extraocular movements intact.     Conjunctiva/sclera:     Right eye: Right conjunctiva is injected (mildly injected).     Pupils: Pupils are equal, round, and reactive to light.  Cardiovascular:     Rate and Rhythm: Normal rate and regular rhythm.  Pulmonary:     Effort: Pulmonary effort is normal. No respiratory distress.  Musculoskeletal:     Cervical back: Neck supple.  Skin:    General:  Skin is warm and dry.     Capillary Refill: Capillary refill takes less than 2 seconds.  Neurological:     General: No focal deficit present.     Mental Status: He is alert. Mental status is at baseline.     Motor: No weakness.     Gait: Gait normal.  Psychiatric:        Mood and Affect: Mood normal.        Behavior: Behavior normal.      UC Treatments / Results  Labs (all labs ordered are listed, but only abnormal results are displayed) Labs Reviewed - No data to display  EKG   Radiology No results found.  Procedures Procedures (including critical care time)  Medications Ordered in UC Medications - No data to display  Initial Impression / Assessment and Plan / UC Course  I have reviewed the triage vital signs and the nursing notes.  Pertinent labs & imaging results that were available during my care of the patient were reviewed by me and considered in my medical decision making (see chart for details).   33 year old male presents for right eye irritation and foreign body sensation for the past 3 days.  States he woke up with the symptoms.  20/50 vision in right eye and 20/25 in left.  Mild swelling with slight erythema of right upper eyelid consistent with developing stye.  Slight conjunctival injection.  No drainage.  Treating at this time with warm compresses and erythromycin ophthalmic ointment.  Reviewed return precautions.   Final Clinical Impressions(s) / UC Diagnoses   Final diagnoses:  Irritation of right eye  Hordeolum externum of right upper eyelid     Discharge Instructions      -Your eye is a little irritated and it looks like you are getting a stye -warm compresses to area 4 x daily x 15 min each time -Begin antibiotic ointment 4x daily -F/u with eye doctor if not improving over next week. Can contact [facility]      ED Prescriptions     Medication Sig Dispense Auth. Provider   erythromycin ophthalmic ointment Place a 1/2 inch  ribbon of ointment into the right eyelid q6h x 1 week 3.5 g Arvis Jolan NOVAK, PA-C      PDMP not reviewed this encounter.   Arvis,  Jolan NOVAK, PA-C 05/20/24 1812

## 2024-05-20 NOTE — ED Triage Notes (Signed)
 Pt presents with right eye irritation x 3 days. Pt used an eye wash and eye drops.

## 2024-05-20 NOTE — Discharge Instructions (Addendum)
-  Your eye is a little irritated and it looks like you are getting a stye -warm compresses to area 4 x daily x 15 min each time -Begin antibiotic ointment 4x daily -F/u with eye doctor if not improving over next week. Can contact [facility]
